# Patient Record
Sex: Male | Born: 1975 | Race: White | Hispanic: No | Marital: Married
Health system: Southern US, Community
[De-identification: ages and names within clinical notes are randomized; demographics above are authoritative.]

---

## 2014-01-24 ENCOUNTER — Other Ambulatory Visit (HOSPITAL_COMMUNITY): Payer: Self-pay | Admitting: Specialist

## 2014-01-24 ENCOUNTER — Ambulatory Visit (HOSPITAL_COMMUNITY)
Admission: RE | Admit: 2014-01-24 | Discharge: 2014-01-24 | Disposition: A | Discharge status: Home / Self Care | Payer: Worker's Compensation | Source: Ambulatory Visit | Attending: Specialist | Admitting: Specialist

## 2014-01-24 DIAGNOSIS — M25512 Pain in left shoulder: Secondary | ICD-10-CM

## 2014-01-24 DIAGNOSIS — Z1389 Encounter for screening for other disorder: Secondary | ICD-10-CM | POA: Insufficient documentation

## 2014-01-24 DIAGNOSIS — Z77018 Contact with and (suspected) exposure to other hazardous metals: Secondary | ICD-10-CM | POA: Insufficient documentation

## 2014-10-29 IMAGING — CR DG ORBITS FOR FOREIGN BODY
2 series · 2 of 2 positions shown · non-contrast
Comparison: None.

CLINICAL DATA: Metal working/exposure; clearance prior to MRI

EXAM:
ORBITS FOR FOREIGN BODY - 2 VIEW

[w waters (1 of 2)]
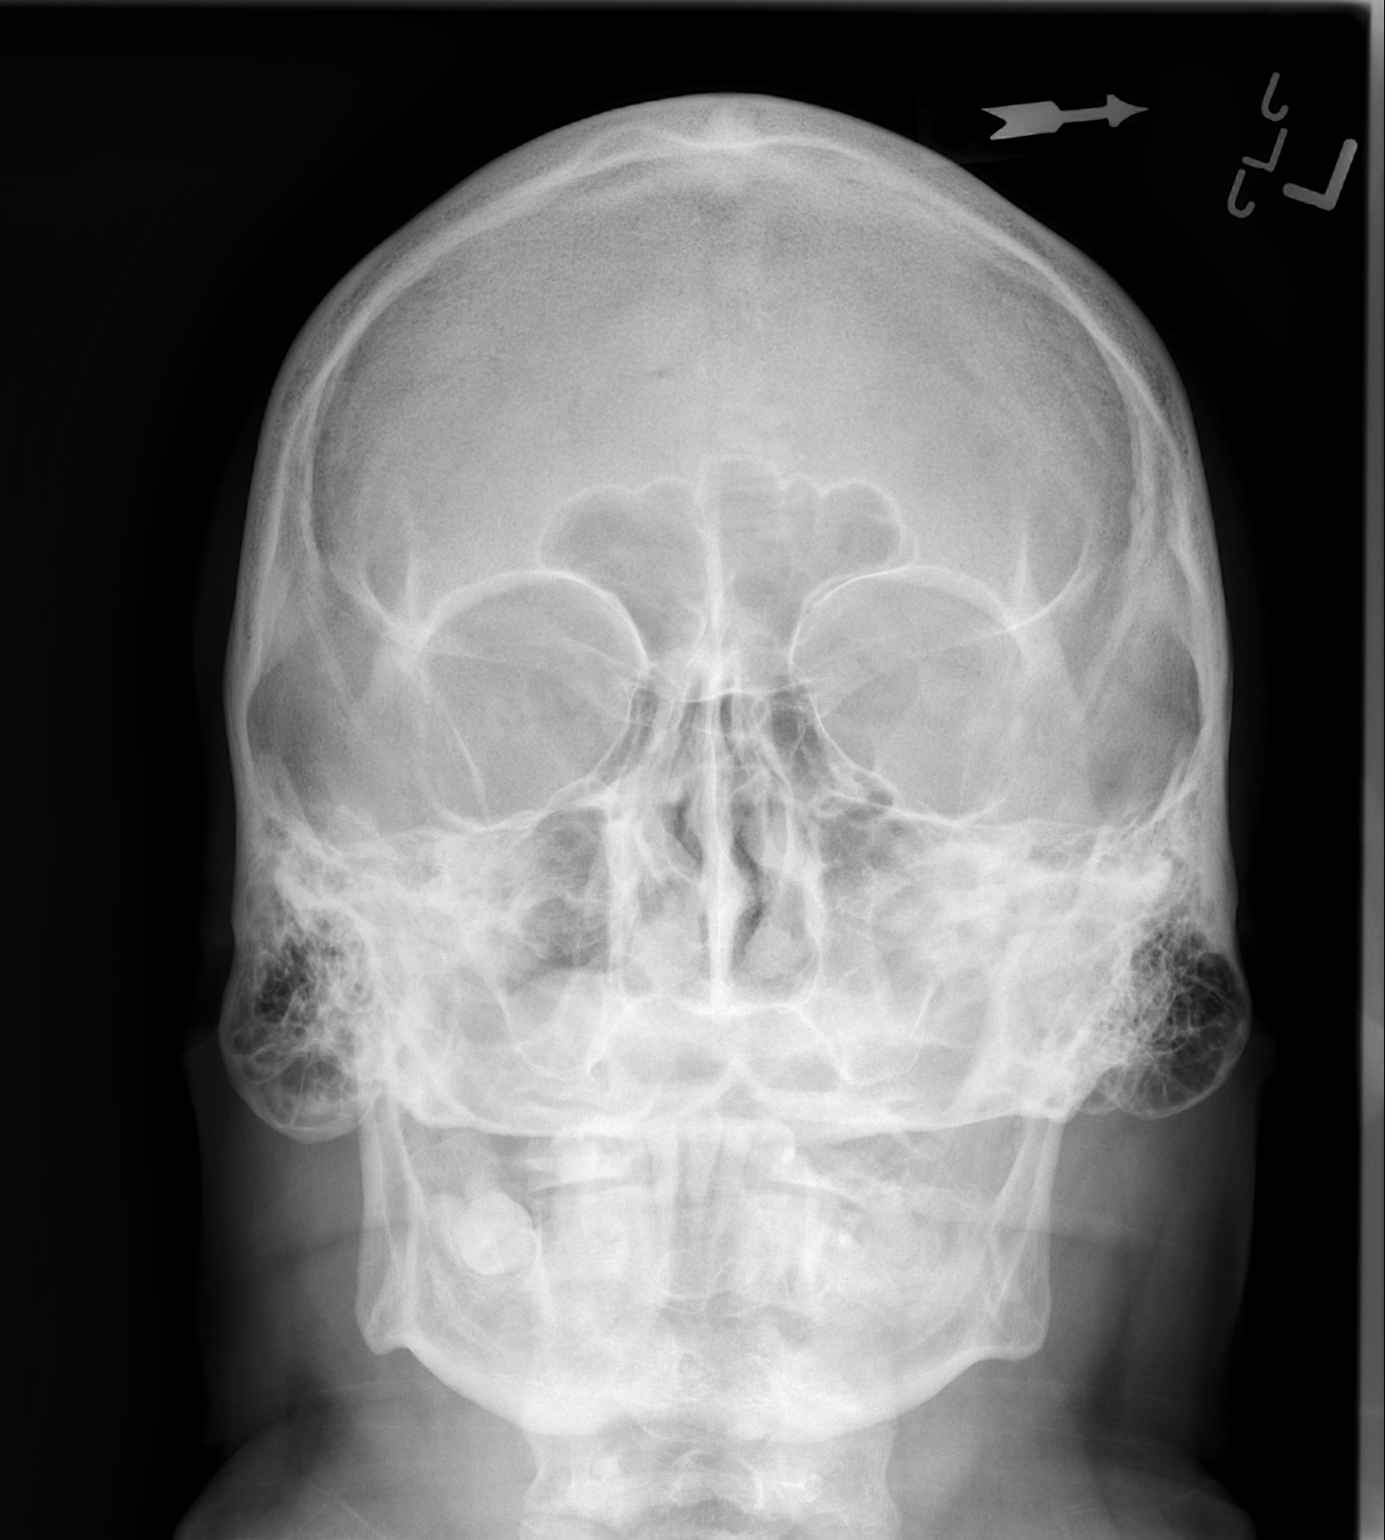

[w waters (2 of 2)]
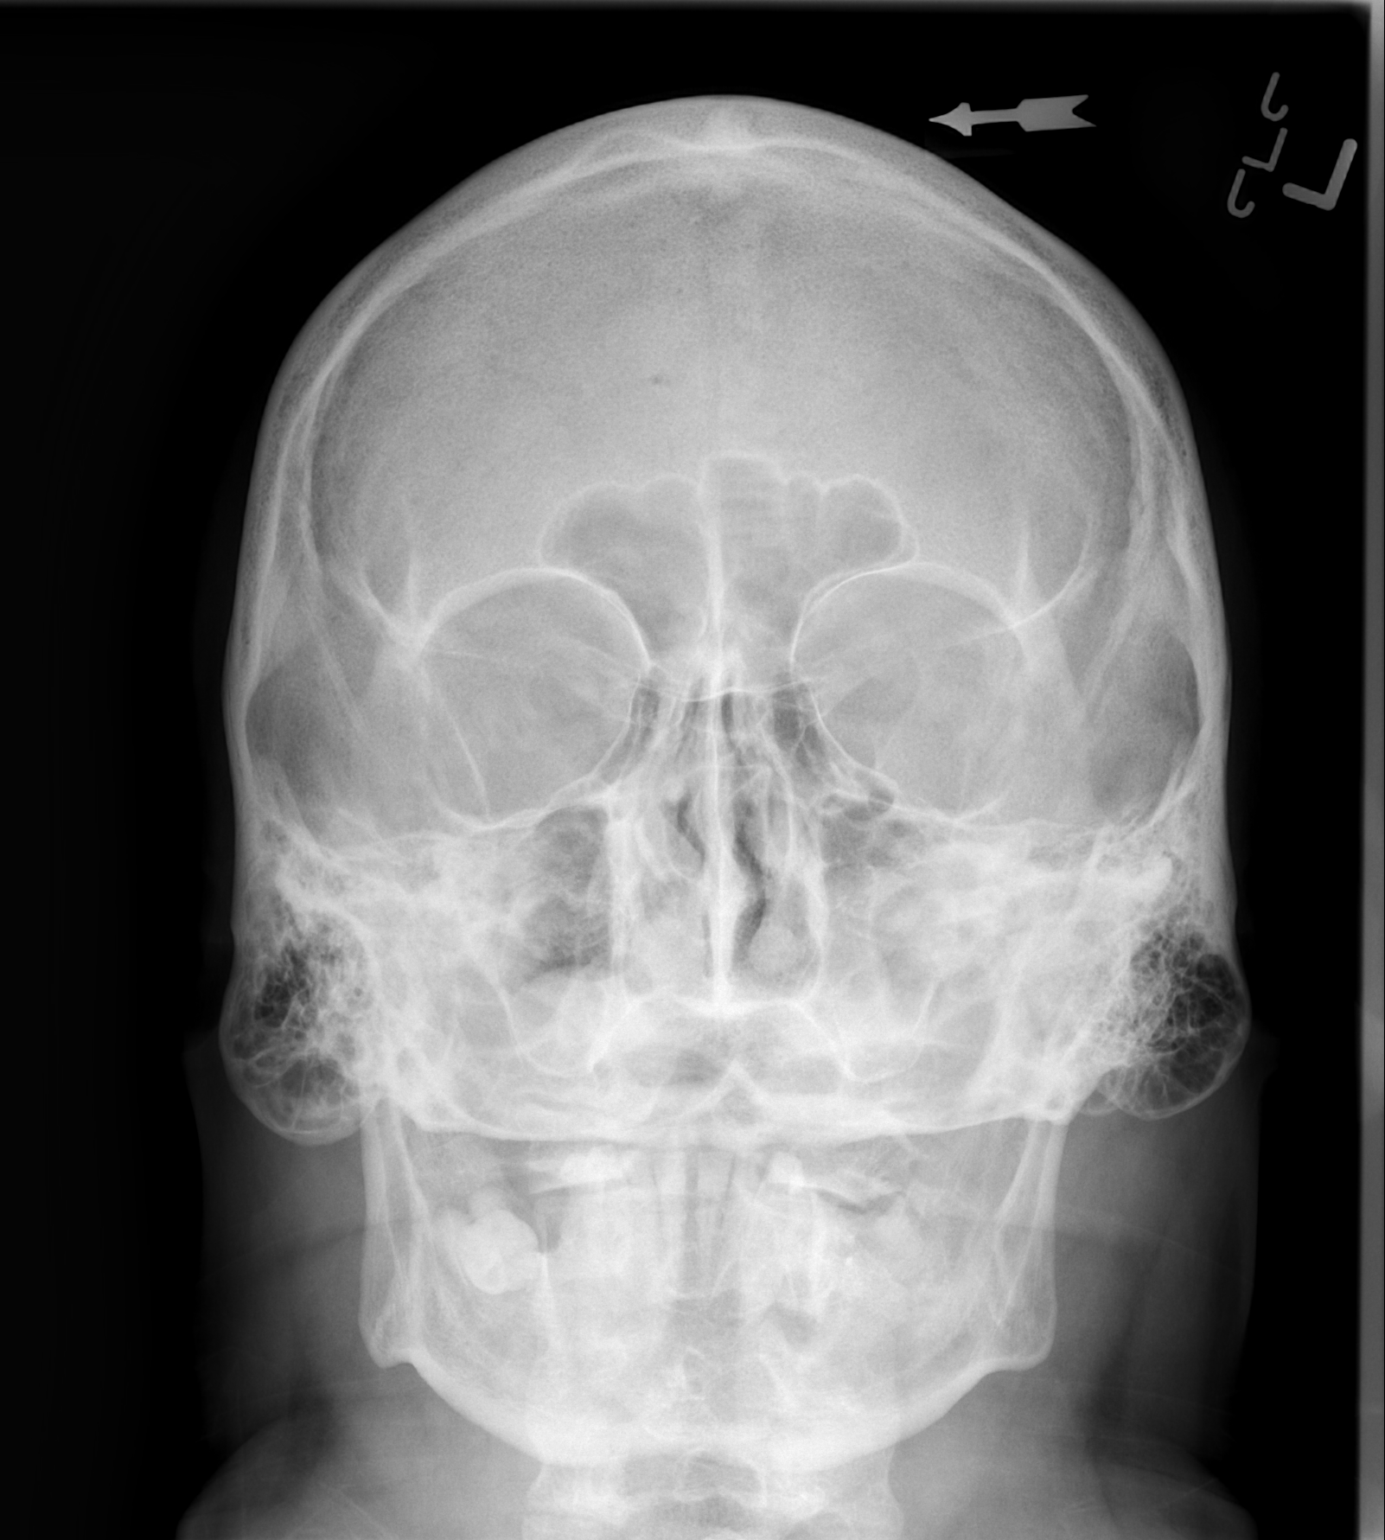

[2 of 2 positions shown; findings below may reference images not displayed]

FINDINGS: There is no evidence of metallic foreign body within the orbits. No
significant bone abnormality identified.
IMPRESSION: No evidence of metallic foreign body within the orbits.

## 2022-10-28 DEATH — deceased
